# Patient Record
Sex: Female | Born: 1975 | Race: White | Hispanic: No | Marital: Married | Smoking: Never smoker
Health system: Southern US, Community
[De-identification: ages and names within clinical notes are randomized; demographics above are authoritative.]

## PROBLEM LIST (undated history)

## (undated) DIAGNOSIS — Z8041 Family history of malignant neoplasm of ovary: Secondary | ICD-10-CM

## (undated) DIAGNOSIS — Z800 Family history of malignant neoplasm of digestive organs: Secondary | ICD-10-CM

## (undated) DIAGNOSIS — Z803 Family history of malignant neoplasm of breast: Secondary | ICD-10-CM

## (undated) HISTORY — DX: Family history of malignant neoplasm of digestive organs: Z80.0

## (undated) HISTORY — DX: Family history of malignant neoplasm of breast: Z80.3

## (undated) HISTORY — DX: Family history of malignant neoplasm of ovary: Z80.41

---

## 2002-03-20 ENCOUNTER — Encounter: Admission: RE | Admit: 2002-03-20 | Discharge: 2002-03-20 | Payer: Self-pay | Admitting: Obstetrics and Gynecology

## 2002-03-20 ENCOUNTER — Other Ambulatory Visit: Admission: RE | Admit: 2002-03-20 | Discharge: 2002-03-20 | Payer: Self-pay | Admitting: Obstetrics and Gynecology

## 2011-10-12 ENCOUNTER — Other Ambulatory Visit: Payer: Self-pay | Admitting: Family Medicine

## 2011-10-12 DIAGNOSIS — Z1231 Encounter for screening mammogram for malignant neoplasm of breast: Secondary | ICD-10-CM

## 2011-11-16 ENCOUNTER — Other Ambulatory Visit: Payer: Self-pay | Admitting: Obstetrics and Gynecology

## 2011-11-16 ENCOUNTER — Other Ambulatory Visit (HOSPITAL_COMMUNITY)
Admission: RE | Admit: 2011-11-16 | Discharge: 2011-11-16 | Disposition: A | Discharge status: Home / Self Care | Payer: Commercial Indemnity | Source: Ambulatory Visit | Attending: Obstetrics and Gynecology | Admitting: Obstetrics and Gynecology

## 2011-11-16 DIAGNOSIS — Z113 Encounter for screening for infections with a predominantly sexual mode of transmission: Secondary | ICD-10-CM | POA: Insufficient documentation

## 2011-11-16 DIAGNOSIS — Z01419 Encounter for gynecological examination (general) (routine) without abnormal findings: Secondary | ICD-10-CM | POA: Insufficient documentation

## 2011-11-16 DIAGNOSIS — N760 Acute vaginitis: Secondary | ICD-10-CM | POA: Insufficient documentation

## 2011-12-15 ENCOUNTER — Ambulatory Visit
Admission: RE | Admit: 2011-12-15 | Discharge: 2011-12-15 | Disposition: A | Discharge status: Home / Self Care | Payer: Commercial Indemnity | Source: Ambulatory Visit | Attending: Family Medicine | Admitting: Family Medicine

## 2011-12-15 DIAGNOSIS — Z1231 Encounter for screening mammogram for malignant neoplasm of breast: Secondary | ICD-10-CM

## 2011-12-20 ENCOUNTER — Other Ambulatory Visit: Payer: Self-pay | Admitting: Family Medicine

## 2011-12-20 DIAGNOSIS — R928 Other abnormal and inconclusive findings on diagnostic imaging of breast: Secondary | ICD-10-CM

## 2011-12-27 ENCOUNTER — Ambulatory Visit
Admission: RE | Admit: 2011-12-27 | Discharge: 2011-12-27 | Disposition: A | Discharge status: Home / Self Care | Payer: Commercial Indemnity | Source: Ambulatory Visit | Attending: Family Medicine | Admitting: Family Medicine

## 2011-12-27 ENCOUNTER — Other Ambulatory Visit: Payer: Self-pay | Admitting: Family Medicine

## 2011-12-27 DIAGNOSIS — R928 Other abnormal and inconclusive findings on diagnostic imaging of breast: Secondary | ICD-10-CM

## 2012-01-08 ENCOUNTER — Ambulatory Visit
Admission: RE | Admit: 2012-01-08 | Discharge: 2012-01-08 | Disposition: A | Discharge status: Home / Self Care | Payer: Commercial Indemnity | Source: Ambulatory Visit | Attending: Family Medicine | Admitting: Family Medicine

## 2012-01-08 ENCOUNTER — Other Ambulatory Visit: Payer: Self-pay | Admitting: Family Medicine

## 2012-01-08 DIAGNOSIS — R928 Other abnormal and inconclusive findings on diagnostic imaging of breast: Secondary | ICD-10-CM

## 2013-04-21 ENCOUNTER — Other Ambulatory Visit: Payer: Self-pay | Admitting: Family Medicine

## 2013-04-21 DIAGNOSIS — N6310 Unspecified lump in the right breast, unspecified quadrant: Secondary | ICD-10-CM

## 2013-07-08 ENCOUNTER — Ambulatory Visit
Admission: RE | Admit: 2013-07-08 | Discharge: 2013-07-08 | Disposition: A | Discharge status: Home / Self Care | Payer: Managed Care, Other (non HMO) | Source: Ambulatory Visit | Attending: Family Medicine | Admitting: Family Medicine

## 2013-07-08 ENCOUNTER — Other Ambulatory Visit: Payer: Self-pay | Admitting: Family Medicine

## 2013-07-08 ENCOUNTER — Ambulatory Visit
Admission: RE | Admit: 2013-07-08 | Discharge: 2013-07-08 | Disposition: A | Discharge status: Home / Self Care | Payer: Managed Care, Other (non HMO) | Source: Ambulatory Visit

## 2013-07-08 DIAGNOSIS — N6310 Unspecified lump in the right breast, unspecified quadrant: Secondary | ICD-10-CM

## 2015-06-10 ENCOUNTER — Other Ambulatory Visit: Payer: Self-pay

## 2015-06-10 DIAGNOSIS — Z1231 Encounter for screening mammogram for malignant neoplasm of breast: Secondary | ICD-10-CM

## 2015-06-28 ENCOUNTER — Ambulatory Visit
Admission: RE | Admit: 2015-06-28 | Discharge: 2015-06-28 | Disposition: A | Discharge status: Home / Self Care | Payer: Managed Care, Other (non HMO) | Source: Ambulatory Visit

## 2015-06-28 DIAGNOSIS — Z1231 Encounter for screening mammogram for malignant neoplasm of breast: Secondary | ICD-10-CM

## 2018-01-17 ENCOUNTER — Other Ambulatory Visit: Payer: Self-pay | Admitting: Internal Medicine

## 2018-01-17 DIAGNOSIS — Z1231 Encounter for screening mammogram for malignant neoplasm of breast: Secondary | ICD-10-CM

## 2018-02-26 ENCOUNTER — Ambulatory Visit
Admission: RE | Admit: 2018-02-26 | Discharge: 2018-02-26 | Disposition: A | Discharge status: Home / Self Care | Payer: Managed Care, Other (non HMO) | Source: Ambulatory Visit | Attending: Internal Medicine | Admitting: Internal Medicine

## 2018-02-26 DIAGNOSIS — Z1231 Encounter for screening mammogram for malignant neoplasm of breast: Secondary | ICD-10-CM

## 2018-04-04 ENCOUNTER — Telehealth: Payer: Self-pay | Admitting: Genetic Counselor

## 2018-04-04 ENCOUNTER — Encounter: Payer: Self-pay | Admitting: Genetic Counselor

## 2018-04-04 NOTE — Telephone Encounter (Signed)
A genetic counseling appt has been scheduled for the pt to see Maylon Cos on 3/9 at 2pm. Letter mailed.

## 2018-04-22 ENCOUNTER — Encounter: Payer: Self-pay | Admitting: Genetic Counselor

## 2018-04-22 ENCOUNTER — Inpatient Hospital Stay: Payer: Managed Care, Other (non HMO) | Attending: Genetic Counselor | Admitting: Genetic Counselor

## 2018-04-22 ENCOUNTER — Inpatient Hospital Stay: Payer: Managed Care, Other (non HMO)

## 2018-04-22 DIAGNOSIS — Z800 Family history of malignant neoplasm of digestive organs: Secondary | ICD-10-CM | POA: Diagnosis not present

## 2018-04-22 DIAGNOSIS — Z8041 Family history of malignant neoplasm of ovary: Secondary | ICD-10-CM | POA: Insufficient documentation

## 2018-04-22 DIAGNOSIS — Z803 Family history of malignant neoplasm of breast: Secondary | ICD-10-CM | POA: Diagnosis not present

## 2018-04-22 NOTE — Progress Notes (Signed)
REFERRING PROVIDER: Garwin Brothers, MD [address], [place] [address]  PRIMARY PROVIDER:  Garwin Brothers, MD  PRIMARY REASON FOR VISIT:  1. Family history of breast cancer   2. Family history of ovarian cancer   3. Family history of pancreatic cancer      HISTORY OF PRESENT ILLNESS:   [name], a 43 y.o. female, was seen for a [facility] cancer genetics consultation at the request of Dr. Reesa Chew due to a family history of breast, ovarian and pancreatic cancer.  [name] presents to clinic today to discuss the possibility of a hereditary predisposition to cancer, genetic testing, and to further clarify her future cancer risks, as well as potential cancer risks for family members.   [name] is a 43 y.o. female with no personal history of cancer.  She has several family members who have had early onset breast or ovarian cancer and had reportedly negative testing. [name] is unsure if the testing was more extensive than just BRCA1/BRCA2.  CANCER HISTORY:   No history exists.     RISK FACTORS:  Menarche was at age 59.  First live birth at age 45.  OCP use for approximately 0 years.  Ovaries intact: yes.  Hysterectomy: no.  Menopausal status: premenopausal.  HRT use: 0 years. Colonoscopy: no; not examined. Mammogram within the last year: yes. Number of breast biopsies: 1. Up to date with pelvic exams: yes. Any excessive radiation exposure in the past: no  Past Medical History:  Diagnosis Date  . Family history of breast cancer   . Family history of ovarian cancer   . Family history of pancreatic cancer     Social History   Socioeconomic History  . Marital status: Single    Spouse name: Not on file  . Number of children: Not on file  . Years of education: Not on file  . Highest education level: Not on file  Occupational History  . Not on file  Social Needs  . Financial resource strain: Not on file  . Food insecurity:    Worry: Not on file    Inability: Not on file   . Transportation needs:    Medical: Not on file    Non-medical: Not on file  Tobacco Use  . Smoking status: Not on file  Substance and Sexual Activity  . Alcohol use: Not on file  . Drug use: Not on file  . Sexual activity: Not on file  Lifestyle  . Physical activity:    Days per week: Not on file    Minutes per session: Not on file  . Stress: Not on file  Relationships  . Social connections:    Talks on phone: Not on file    Gets together: Not on file    Attends religious service: Not on file    Active member of club or organization: Not on file    Attends meetings of clubs or organizations: Not on file    Relationship status: Not on file  Other Topics Concern  . Not on file  Social History Narrative  . Not on file     FAMILY HISTORY:  We obtained a detailed, 4-generation family history.  Significant diagnoses are listed below: Family History  Problem Relation Age of Onset  . Breast cancer Mother 13  . Ovarian cancer Mother 78  . Melanoma Father   . Breast cancer Sister 42       d. 80  . Lymphoma Maternal  Aunt   . Lung cancer Maternal Uncle   . Pancreatic cancer Maternal Grandfather   . Stroke Paternal Grandmother   . Stroke Paternal Grandfather   . Brain cancer Other        MGMs sister  . Ovarian cancer Sister 50    The patient has two sons and a daughter who are cancer free.  She has a brother and two sisters.  One sister was diagnosed with breast cancer at 31 and died at 57. She reportedly had negative genetic testing. The other sister went through a hysterectomy for bleeding issues and was diagnosed with ovarian cancer.  The patient's parents are living.  The patient's mother had breast cancer at 45 and ovarian cancer at 89.  She reportedly has had negative genetic testing in the past.  She had a full sister and brother and a paternal half sister and brother.  Her full sister had lymphoma a and her full brother had lung cancer.  Her half sister had a son with a  brain tumor.  The maternal grandparents are deceased. The grandfather had pancreatic cancer and his sister had lymphoma.  The patient's father had melanoma.  He had two brothers and a sister who are cancer free.  His parents both died of a stroke.  Sarah Dunn is aware of previous family history of genetic testing for hereditary cancer risks. Patient's maternal ancestors are of Caucasian descent, and paternal ancestors are of [place] descent. There is no reported Ashkenazi Jewish ancestry. There is no known consanguinity.  GENETIC COUNSELING ASSESSMENT: [name] is a 43 y.o. female with a family history of breast, ovarian and pancreatic cancer which is somewhat suggestive of a hereditary cancer syndrome and predisposition to cancer. We, therefore, discussed and recommended the following at today's visit.   DISCUSSION: We discussed that 5 - 10% of breast cancer is hereditary, with most cases associated with BRCA mutations.  There are other genes that can be associated with hereditary breast cancer syndromes.  These include ATM, CHEK2 and PALB2.  Ovarian cancer can also be hereditary in about 15-20% of cases, most commonly due to BRCA mutations.  Although, just like breast cancer, there are other genes that can increase the risk for ovarian cancer outside of BRCA mutations.  The patient's sister and mother reportedly had negative genetic testing.  We discussed that if their testing was negative, then hers most likely would be as well..    We reviewed the characteristics, features and inheritance patterns of hereditary cancer syndromes. We also discussed genetic testing, including the appropriate family members to test, the process of testing, insurance coverage and turn-around-time for results. We discussed the implications of a negative, positive and/or variant of uncertain significant result. We recommended [name] pursue genetic testing for the CustomNext-Cancer+RNAinsight gene panel. The CustomNext-Expanded  gene panel offered by [facility] and includes sequencing and rearrangement analysis for the following 81 genes: AIP, ALK, APC*, ATM*, AXIN2, BAP1, BARD1, BLM, BMPR1A, BRCA1*, BRCA2*, BRIP1*, CDC73, CDH1*, CDK4, CDKN1B, CDKN2A, CHEK2*, CTNNA1, DICER1, FANCC, FH, FLCN, GALNT12, HOXB13, KIT, MAX, MEN1, MET, MLH1*, MRE11A, MSH2*, MSH6*, MUTYH*, NBN, NF1*, NF2, NTHL1, PALB2*, PDGFRA, PHOX2B, PMS2*, POLD1, POLE, POT1, PRKAR1A, PTCH1, PTEN*, RAD50, RAD51C*, RAD51D*, RB1, RET, SDHA, SDHAF2, SDHB, SDHC, SDHD, SMAD4, SMARCA4, SMARCB1, SMARCE1, STK11, SUFU, TMEM127, TP53*, TSC1, TSC2, VHL and XRCC2 (sequencing and deletion/duplication); CASR, CFTR, CPA1, CTRC, EGFR, MITF, PRSS1 and SPINK1 (sequencing only); EPCAM and GREM1 (deletion/duplication only). DNA and RNA analyses performed for * genes.  Based on Ms. Schoff's family history of cancer, she meets medical criteria for genetic testing. Despite that she meets criteria, she may still have an out of pocket cost. We discussed that if her out of pocket cost for testing is over $100, the laboratory will call and confirm whether she wants to proceed with testing.  If the out of pocket cost of testing is less than $100 she will be billed by the genetic testing laboratory.   PLAN: After considering the risks, benefits, and limitations, [name] provided informed consent to pursue genetic testing and the blood sample was sent to [facility] for analysis of the CustomNext-Cancer+RNAinsight. Results should be available within approximately 2-3 weeks' time, at which point they will be disclosed by telephone to [name], as will any additional recommendations warranted by these results. [name] will receive a summary of her genetic counseling visit and a copy of her results once available. This information will also be available in Epic.   Lastly, we encouraged [name] to remain in contact with cancer genetics annually so that we can continuously update the  family history and inform her of any changes in cancer genetics and testing that may be of benefit for this family.   [name]'s questions were answered to her satisfaction today. Our contact information was provided should additional questions or concerns arise. Thank you for the referral and allowing [place] to share in the care of your patient.    P. Florene Glen, [place], [facility] Certified Genetic Counselor [email or web address] phone: [phone]  The patient was seen for a total of 45 minutes in face-to-face genetic counseling.  This patient was discussed with Drs. Magrinat, Lindi Adie and/or Burr Medico who agrees with the above.    _______________________________________________________________________ For Office Staff:  Number of people involved in session: 1 Was an Intern/ student involved with case: no

## 2018-05-09 ENCOUNTER — Encounter: Payer: Self-pay | Admitting: Genetic Counselor

## 2018-05-09 ENCOUNTER — Telehealth: Payer: Self-pay | Admitting: Genetic Counselor

## 2018-05-09 ENCOUNTER — Ambulatory Visit: Payer: Self-pay | Admitting: Genetic Counselor

## 2018-05-09 DIAGNOSIS — Z1379 Encounter for other screening for genetic and chromosomal anomalies: Secondary | ICD-10-CM

## 2018-05-09 DIAGNOSIS — Z0000 Encounter for general adult medical examination without abnormal findings: Secondary | ICD-10-CM | POA: Insufficient documentation

## 2018-05-09 NOTE — Telephone Encounter (Signed)
LM on VM that results are back and to please CB. 

## 2018-05-09 NOTE — Telephone Encounter (Signed)
Revealed negative genetic testing for hereditary cancer syndromes.  Discussed that we do not know why there is cancer in the family. It could be due to a different gene that we are not testing, or maybe our current technology may not be able to pick something up.  It will be important for her to keep in contact with genetics to keep up with whether additional testing may be needed.  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  Her children would be eligible for screening to see if they too are carriers as well as her siblings.

## 2018-05-09 NOTE — Progress Notes (Signed)
HPI:  Sarah Dunn was previously seen in the [place] clinic due to a family history of cancer and concerns regarding a hereditary predisposition to cancer. Please refer to our prior cancer genetics clinic note for more information regarding our discussion, assessment and recommendations, at the time. Sarah Dunn's recent genetic test results were disclosed to her, as were recommendations warranted by these results. These results and recommendations are discussed in more detail below.  CANCER HISTORY:   No history exists.    FAMILY HISTORY:  We obtained a detailed, 4-generation family history.  Significant diagnoses are listed below: Family History  Problem Relation Age of Onset   Breast cancer Mother 67   Ovarian cancer Mother 61   Melanoma Father    Breast cancer Sister 31       d. 41   Lymphoma Maternal Aunt    Lung cancer Maternal Uncle    Pancreatic cancer Maternal Grandfather    Stroke Paternal Grandmother    Stroke Paternal Grandfather    Brain cancer Other        MGMs sister   Ovarian cancer Sister 12    The patient has two sons and a daughter who are cancer free.  She has a brother and two sisters.  One sister was diagnosed with breast cancer at 8 and died at 99. She reportedly had negative genetic testing. The other sister went through a hysterectomy for bleeding issues and was diagnosed with ovarian cancer.  The patient's parents are living.  The patient's mother had breast cancer at 21 and ovarian cancer at 76.  She reportedly has had negative genetic testing in the past.  She had a full sister and brother and a paternal half sister and brother.  Her full sister had lymphoma a and her full brother had lung cancer.  Her half sister had a son with a brain tumor.  The maternal grandparents are deceased. The grandfather had pancreatic cancer and his sister had lymphoma.  The patient's father had melanoma.  He had two brothers and a sister who are cancer  free.  His parents both died of a stroke.  Sarah Dunn is aware of previous family history of genetic testing for hereditary cancer risks. Patient's maternal ancestors are of Caucasian descent, and paternal ancestors are of [place] descent. There is no reported Ashkenazi Jewish ancestry. There is no known consanguinity.   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  Since [name] has only one pathogenic mutation in CFTR, she is NOT affected with Cystic Fibrosis, but instead is a carrier. The CustomNext-Expanded gene panel offered by [facility] and includes sequencing and rearrangement analysis for the following 81 genes: AIP, ALK, APC*, ATM*, AXIN2, BAP1, BARD1, BLM, BMPR1A, BRCA1*, BRCA2*, BRIP1*, CDC73, CDH1*, CDK4, CDKN1B, CDKN2A, CHEK2*, CTNNA1, DICER1, FANCC, FH, FLCN, GALNT12, HOXB13, KIT, MAX, MEN1, MET, MLH1*, MRE11A, MSH2*, MSH6*, MUTYH*, NBN, NF1*, NF2, NTHL1, PALB2*, PDGFRA, PHOX2B, PMS2*, POLD1, POLE, POT1, PRKAR1A, PTCH1, PTEN*, RAD50, RAD51C*, RAD51D*, RB1, RET, SDHA, SDHAF2, SDHB, SDHC, SDHD, SMAD4, SMARCA4, SMARCB1, SMARCE1, STK11, SUFU, TMEM127, TP53*, TSC1, TSC2, VHL and XRCC2 (sequencing and deletion/duplication); CASR, CFTR, CPA1, CTRC, EGFR, MITF, PRSS1 and SPINK1 (sequencing only); EPCAM and GREM1 (deletion/duplication only). DNA and RNA analyses performed for * genes. The test report has been scanned into EPIC and is located under the Molecular Pathology section of the Results Review tab.  A portion of the result report is included below for reference.    CLINICAL CONDITION: The CFTR gene is associated with autosomal recessive cystic fibrosis (MedGen UID: 33354). Other CFTR-related disorders include congenital bilateral absence of the vas deferens (CBAVD; MedGen UID: [id number]) and an increased risk for pancreatitis (PMID:  38937342, 87681157).  Hereditary pancreatitis is characterized by recurring episodes of acute inflammation of the pancreas beginning in childhood or adolescence and leading to chronic pancreatitis. The clinical presentation is highly variable and may include chronic abdominal pain, impairment of endocrine and exocrine pancreatic function, nausea and vomiting, maldigestion, and diabetes (WIOM:[id number]). Pathogenic variants in CFTR may confer an approximately four- to ten-fold increased risk for chronic pancreatitis in heterozygous carriers. Other genetic and environmental risk factors are also known to modify this risk (PMID: 38453646, 8032122, 48250037, 04888916, 94503888, 28003491, 79150569). Chronic pancreatitis is a risk factor for pancreatic cancer (PMID: 79480165).  CANCER SCREENING RECOMMENDATIONS: The single pathogenic mutation in CFTR does not explain the cancer in her family, and therefore we have not identified a hereditary cause for her family history of cancer at this time. Most cancers happen by chance and this negative test suggests that her cancer may fall into this category.    While reassuring, this does not definitively rule out a hereditary predisposition to cancer. It is still possible that there could be genetic mutations that are undetectable by current technology. There could be genetic mutations in genes that have not been tested or identified to increase cancer risk.  Therefore, it is recommended she continue to follow the cancer management and screening guidelines provided by her primary healthcare provider.   An individual's cancer risk and medical management are not determined by genetic test results alone. Overall cancer risk assessment incorporates additional factors, including personal medical history, family history, and any available genetic information that may result in a personalized plan for cancer prevention and surveillance  INHERITANCE:  Cystic fibrosis is inherited  in an autosomal recessive inheritance, with affected individuals having two pathogenic variants-one in each copy of their CFTR genes. Affected individuals will pass one pathogenic CFTR variant to all of their children. For partners who each carry a pathogenic variant in CFTR, the risk of having have an affected child is 25% (per pregnancy).  The increased risk for hereditary pancreatitis follows an autosomal dominant pattern of inheritance. This means that an individual with a single pathogenic variant has a 50% chance of passing along that variant and the increased disease risk to their offspring.  Family members of individuals with pathogenic CFTR variants may consider genetic testing, as they may be carriers as well.  RECOMMENDATIONS FOR FAMILY MEMBERS:  Carriers of a single CFTR pathogenic mutation are also at an increased risk of having a child affected with CF. For those of reproductive age, partners of known carriers should consider genetic testing to determine their reproductive risk. Around 1 in 5 people in the [place] are carriers of CF ([place]. http://www.anderson-foster.com/.); however, this risk varies slightly based on ethnicity. Reproductive options are available for at-risk carrier couples, including prenatal diagnosis, IVF with preimplantation genetic diagnosis (PGD), gamete donation, and adoption. Additionally, newborn screening for CF is available in every state.  It is important that all of Ms. Hove's relatives (both men and women) know of the presence of this gene mutation. Site-specific genetic testing can sort out who in the family is at risk and who is not.   Ms. Whittinghill's siblings and children have a 50% chance to  have inherited this mutation. We recommend they have genetic testing for this same mutation, as identifying the presence of this mutation would allow them to also take advantage of risk-reducing measures. However, one of her children is relatively young (e.g. under 18) and this  will not be of any consequence to them for several years. We do not test children because there is no risk to them until they are adults. We recommend they have genetic counseling and testing by the time they are in their early 20's.    Individuals in this family might be at some increased risk of developing cancer, over the general population risk, simply due to the family history of cancer.  We recommended women in this family have a yearly mammogram beginning at age 25, or 9 years younger than the earliest onset of cancer, an annual clinical breast exam, and perform monthly breast self-exams. Women in this family should also have a gynecological exam as recommended by their primary provider. All family members should have a colonoscopy by age 27.  It is also possible there is a hereditary cause for the cancer in Ms. Handyside's family that she did not inherit and therefore was not identified in her.  Based on Ms. Furuya's family history, we recommended her mother and sister, who were diagnosed with breast and ovarian cancer, respectively, have genetic counseling and testing. Ms. Stotler will let us know if we can be of any assistance in coordinating genetic counseling and/or testing for this family member.   FOLLOW-UP: Lastly, we discussed with Ms. Counterman that cancer genetics is a rapidly advancing field and it is possible that new genetic tests will be appropriate for her and/or her family members in the future. We encouraged her to remain in contact with cancer genetics on an annual basis so we can update her personal and family histories and let her know of advances in cancer genetics that may benefit this family.   Our contact number was provided. Ms. Warga's questions were answered to her satisfaction, and she knows she is welcome to call us at anytime with additional questions or concerns.   Roma Kayser, MS, [facility] Certified Genetic Counselor [email or web address]

## 2018-05-10 ENCOUNTER — Encounter: Payer: Self-pay | Admitting: Genetic Counselor

## 2020-02-04 IMAGING — MG DIGITAL SCREENING BILATERAL MAMMOGRAM WITH TOMO AND CAD
8 series · 8 of 24 positions shown · non-contrast
Comparison: Previous exam(s).

CLINICAL DATA: Screening.

EXAM:
DIGITAL SCREENING BILATERAL MAMMOGRAM WITH TOMO AND CAD

[L MLO synth-2D]
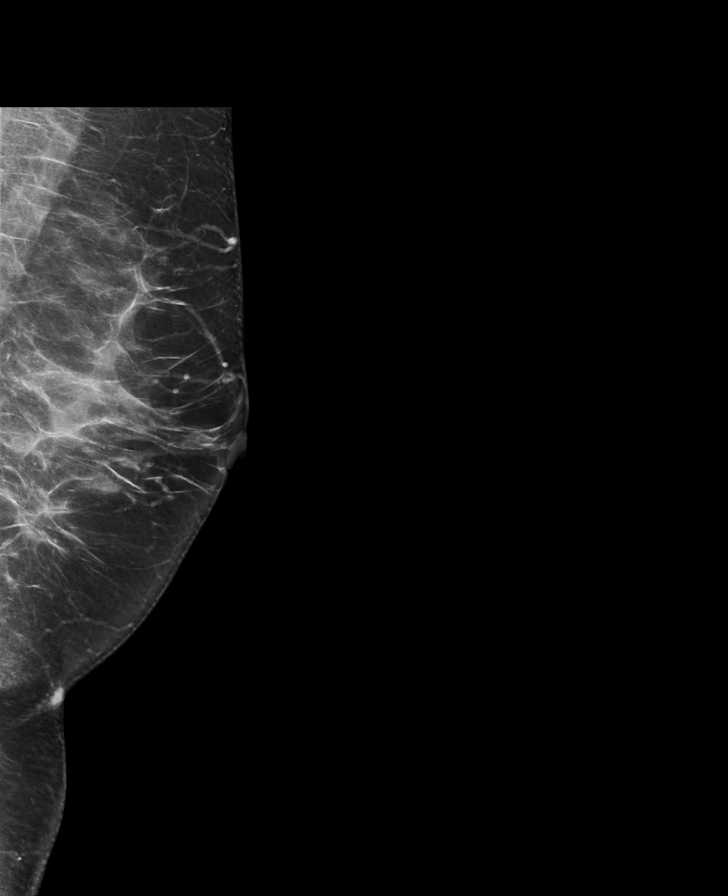

[R CC synth-2D]
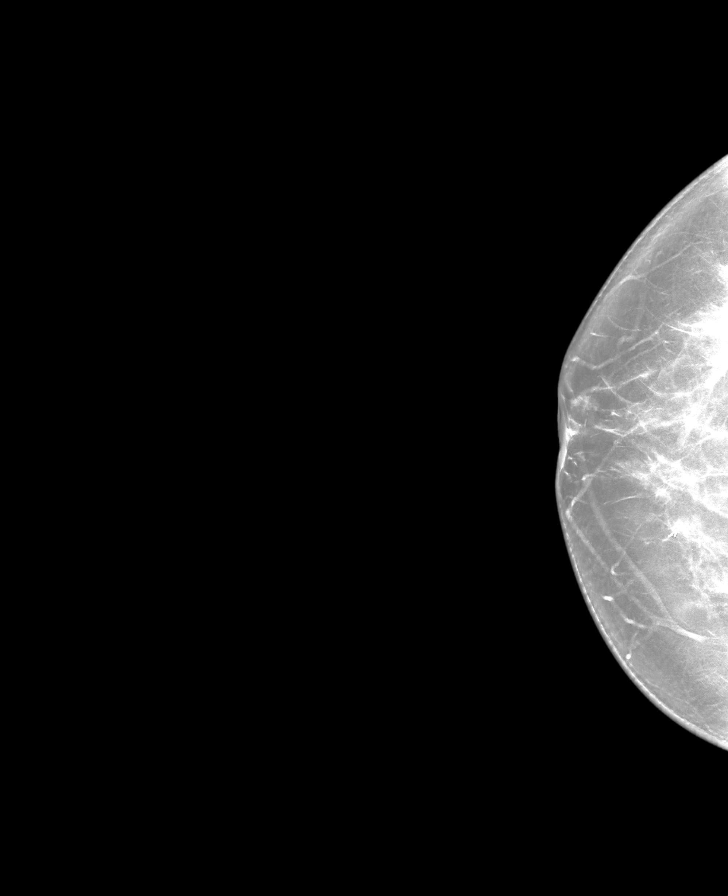

[R MLO synth-2D]
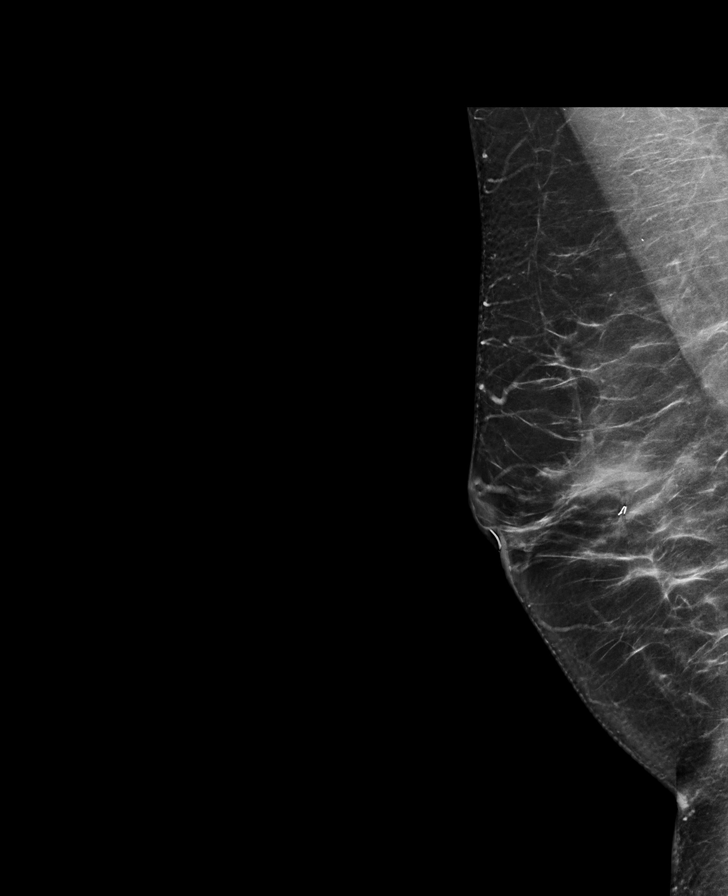

[L CC synth-2D]
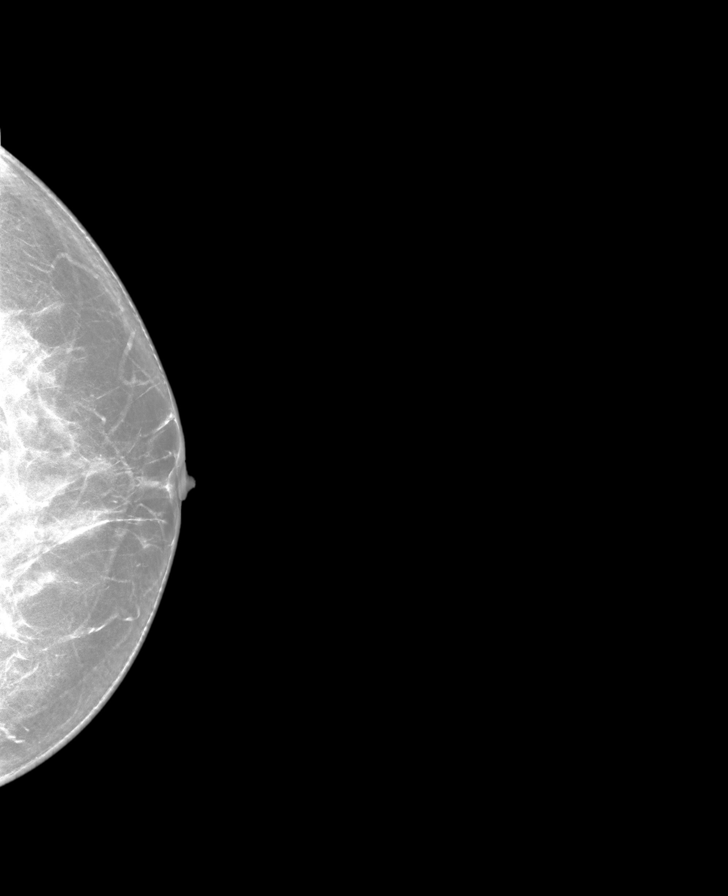

[R MLO tomo · tomo slice 43/86.0]
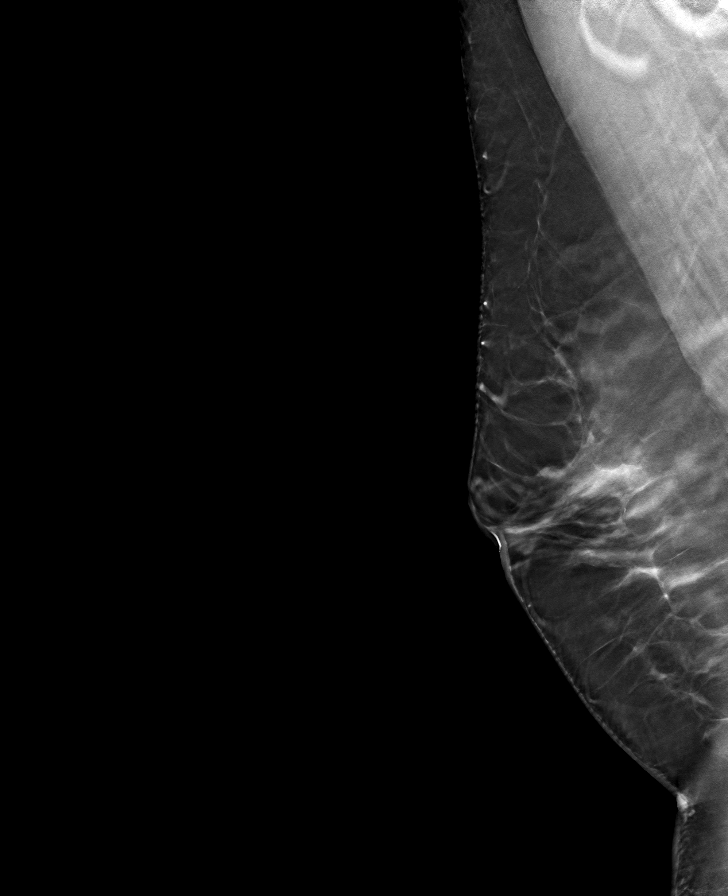

[R CC tomo · tomo slice 39/78.0]
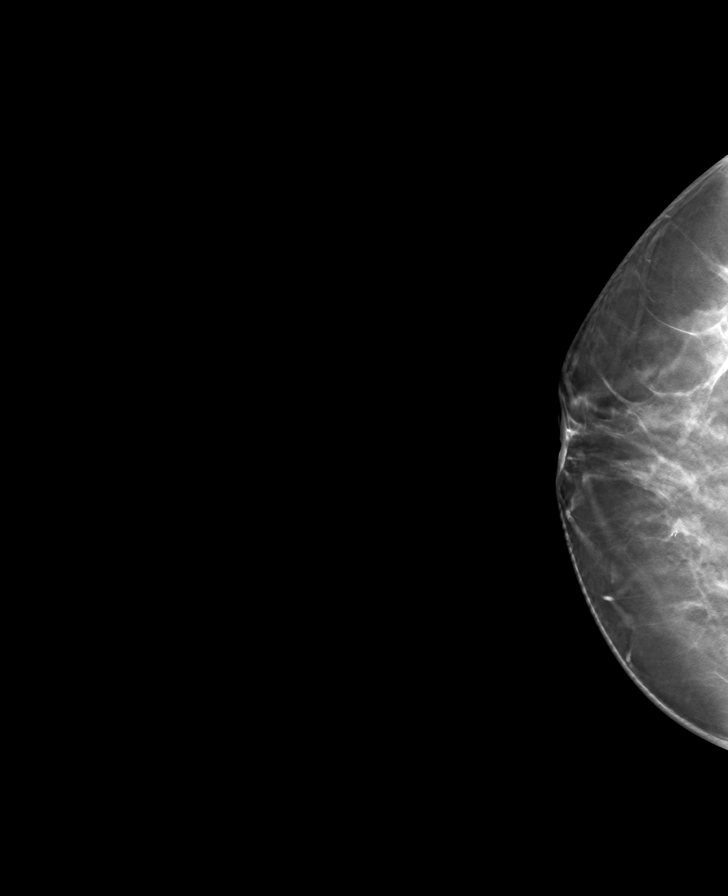

[L CC tomo · tomo slice 37/74.0]
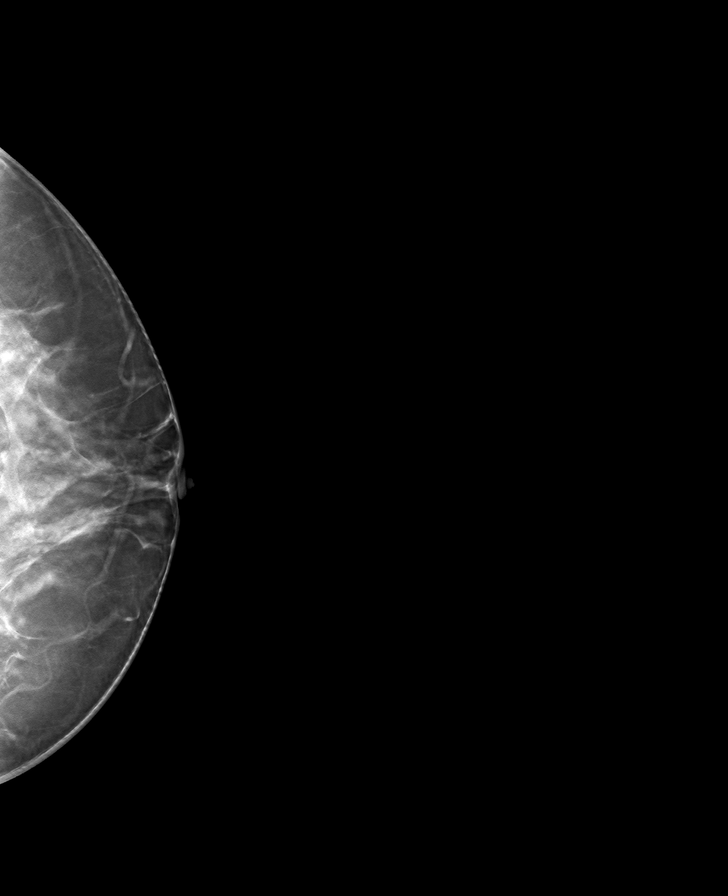

[L MLO tomo · tomo slice 43/84.0]
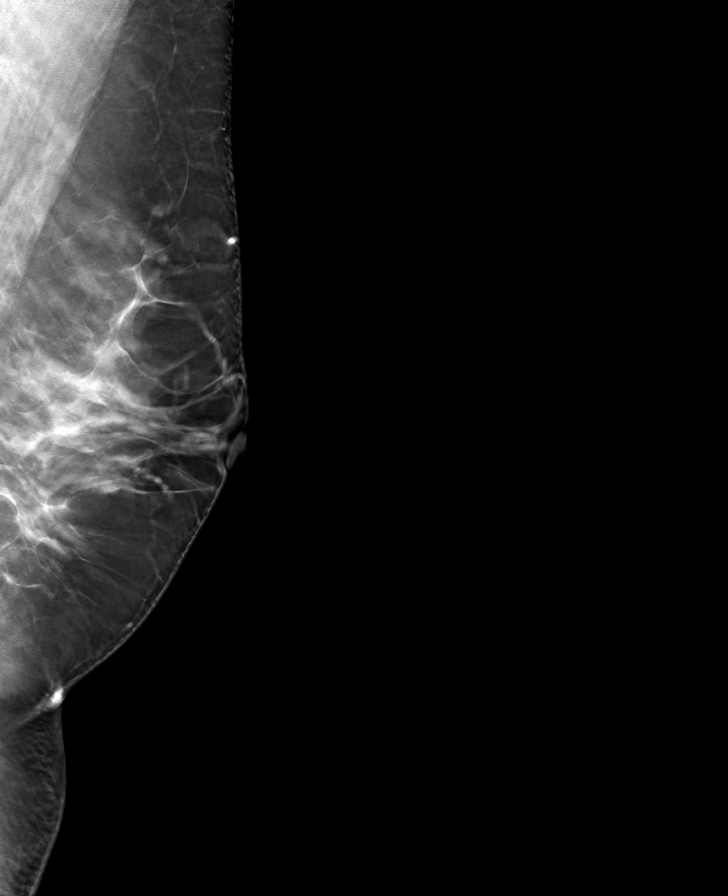

[8 of 24 positions shown; findings below may reference images not displayed]

ACR Breast Density Category c: The breast tissue is heterogeneously
dense, which may obscure small masses.
FINDINGS: There are no findings suspicious for malignancy. Images were
processed with CAD.
IMPRESSION: No mammographic evidence of malignancy. A result letter of this
screening mammogram will be mailed directly to the patient.

RECOMMENDATION:
Screening mammogram in one year. (Code:[id number]-LHB)

BI-RADS CATEGORY  1: Negative.

## 2022-03-13 ENCOUNTER — Ambulatory Visit: Payer: Managed Care, Other (non HMO) | Admitting: Internal Medicine

## 2022-03-13 ENCOUNTER — Encounter: Payer: Self-pay | Admitting: Internal Medicine

## 2022-03-13 VITALS — BP 130/100 | HR 93 | Temp 98.3°F | Resp 18 | Ht 69.0 in | Wt 260.0 lb

## 2022-03-13 DIAGNOSIS — F419 Anxiety disorder, unspecified: Secondary | ICD-10-CM

## 2022-03-13 DIAGNOSIS — F3342 Major depressive disorder, recurrent, in full remission: Secondary | ICD-10-CM | POA: Diagnosis not present

## 2022-03-13 DIAGNOSIS — R002 Palpitations: Secondary | ICD-10-CM | POA: Diagnosis not present

## 2022-03-13 DIAGNOSIS — F32A Depression, unspecified: Secondary | ICD-10-CM | POA: Insufficient documentation

## 2022-03-13 MED ORDER — PROPRANOLOL HCL 20 MG PO TABS: 20.0000 mg | ORAL_TABLET | Freq: Two times a day (BID) | ORAL | 3 refills | Status: DC

## 2022-03-13 MED ORDER — BUSPIRONE HCL 7.5 MG PO TABS: 7.5000 mg | ORAL_TABLET | Freq: Two times a day (BID) | ORAL | 3 refills | Status: AC

## 2022-03-13 MED ORDER — BUPROPION HCL ER (XL) 300 MG PO TB24: 300.0000 mg | ORAL_TABLET | Freq: Every day | ORAL | 3 refills | Status: DC

## 2022-03-13 MED ORDER — BUPROPION HCL ER (SR) 150 MG PO TB12: 150.0000 mg | ORAL_TABLET | Freq: Every day | ORAL | 3 refills | Status: DC

## 2022-03-13 NOTE — Progress Notes (Addendum)
   Office Visit  Subjective   Patient ID: Sarah Dunn   DOB: 11-20-1975   Age: 47 y.o.   MRN: [id number]   Chief Complaint Chief Complaint  Patient presents with   Follow-up    Medication refill   Medication Refill     History of Present Illness 47 years old female with history of anxiety, depression, obesity and palpitation is here for follow-up.  She says that she is still depressed even though she take bupropion 450 mg daily.  She does not have any suicidal ideation.  She also has anxiety and sometimes take buspirone 7.5 mg daily. She also has palpitation and her symptoms are controlled with propranolol.  He was supposed to take 20 mg twice a day but take mostly once a day. She is also obese lady with BMI of 38 and she says that she is struggled to lose weight.  She has actually gained 5 more pounds since last visit.  Medication Refill     Past Medical History Past Medical History:  Diagnosis Date   Family history of breast cancer    Family history of ovarian cancer    Family history of pancreatic cancer      Allergies No Known Allergies   Review of Systems Review of Systems  Constitutional: Negative.   HENT: Negative.    Respiratory: Negative.    Cardiovascular: Negative.   Gastrointestinal: Negative.   Neurological: Negative.        Objective:    Vitals BP (!) 130/100 (BP Location: Left Arm, Patient Position: Sitting, Cuff Size: Normal)   Pulse 93   Temp 98.3 F (36.8 C)   Resp 18   Ht 5\' 9"  (1.753 m)   Wt 260 lb (117.9 kg)   SpO2 98%   BMI 38.40 kg/m    Physical Examination Physical Exam Constitutional:      Appearance: Normal appearance. She is obese.  Cardiovascular:     Rate and Rhythm: Normal rate and regular rhythm.     Heart sounds: Normal heart sounds.  Pulmonary:     Effort: Pulmonary effort is normal.     Breath sounds: Normal breath sounds.  Neurological:     General: No focal deficit present.     Mental Status: She is  alert and oriented to person, place, and time.        Assessment & Plan:   Depression stable  Palpitations controlled   Return in about 3 months (around 06/12/2022).  She will come fasting for blood draw next visit.  Garwin Brothers, MD

## 2022-03-13 NOTE — Assessment & Plan Note (Signed)
stable °

## 2022-03-13 NOTE — Assessment & Plan Note (Signed)
controlled 

## 2022-06-14 ENCOUNTER — Encounter: Payer: Self-pay | Admitting: Internal Medicine

## 2022-06-14 ENCOUNTER — Ambulatory Visit: Payer: Managed Care, Other (non HMO) | Admitting: Internal Medicine

## 2022-06-14 VITALS — BP 130/90 | HR 64 | Temp 97.8°F | Resp 18 | Ht 69.0 in | Wt 263.5 lb

## 2022-06-14 DIAGNOSIS — Z131 Encounter for screening for diabetes mellitus: Secondary | ICD-10-CM

## 2022-06-14 DIAGNOSIS — R002 Palpitations: Secondary | ICD-10-CM

## 2022-06-14 DIAGNOSIS — F3342 Major depressive disorder, recurrent, in full remission: Secondary | ICD-10-CM

## 2022-06-14 DIAGNOSIS — Z6838 Body mass index (BMI) 38.0-38.9, adult: Secondary | ICD-10-CM

## 2022-06-14 DIAGNOSIS — R232 Flushing: Secondary | ICD-10-CM | POA: Diagnosis not present

## 2022-06-14 DIAGNOSIS — Z0000 Encounter for general adult medical examination without abnormal findings: Secondary | ICD-10-CM | POA: Diagnosis not present

## 2022-06-14 DIAGNOSIS — F419 Anxiety disorder, unspecified: Secondary | ICD-10-CM | POA: Diagnosis not present

## 2022-06-14 MED ORDER — GABAPENTIN 300 MG PO CAPS: 300.0000 mg | ORAL_CAPSULE | Freq: Every day | ORAL | 2 refills | Status: AC

## 2022-06-14 MED ORDER — WEGOVY 0.25 MG/0.5ML ~~LOC~~ SOAJ: 0.2500 mg | SUBCUTANEOUS | 1 refills | Status: AC

## 2022-06-14 NOTE — Assessment & Plan Note (Signed)
I will do FSH level and start her on gabapentin.

## 2022-06-14 NOTE — Assessment & Plan Note (Signed)
It is in remission

## 2022-06-14 NOTE — Assessment & Plan Note (Signed)
Controlled with propranolol 

## 2022-06-14 NOTE — Addendum Note (Signed)
Addended byEloisa Northern on: 06/14/2022 10:21 AM   Modules accepted: Orders

## 2022-06-14 NOTE — Assessment & Plan Note (Signed)
controlled 

## 2022-06-14 NOTE — Progress Notes (Signed)
   Office Visit  Subjective   Patient ID: Sarah Dunn   DOB: 11-06-1975   Age: 47 y.o.   MRN: [id number]   Chief Complaint Chief Complaint  Patient presents with   Annual Exam    Physical     History of Present Illness 47 years old female is here for annual physical examinations. She has anxiety, depression and palpitations and her symptoms are well controlled with medications.  No chest pain and no shortness of breast.  She work at [employer], she is married. She does not smoke and does not drink. She is still having periods. She still has her periods regular but started having hot flashes.   She says her mother has breast cancer, cervical cancer, she also has defibrillator. Her sister passed away with breast cancer.   She has mammogram over a year ago. She is due for mammogram. Her pap smear was 2019.   She has abnormal LFT where her hepatitis panel was negative. She was referred for liver ultrasound that was normal.   She get flu shot every year has done in October 23. She has one COVID vaccine.   Past Medical History Past Medical History:  Diagnosis Date   Family history of breast cancer    Family history of ovarian cancer    Family history of pancreatic cancer      Allergies No Known Allergies   Review of Systems Review of Systems  Constitutional: Negative.   HENT: Negative.    Respiratory: Negative.    Cardiovascular: Negative.   Gastrointestinal: Negative.   Genitourinary: Negative.   Musculoskeletal: Negative.   Neurological: Negative.        Objective:    Vitals BP (!) 130/90 (BP Location: Left Arm, Patient Position: Sitting, Cuff Size: Normal)   Pulse 64   Temp 97.8 F (36.6 C)   Resp 18   Ht 5\' 9"  (1.753 m)   Wt 263 lb 8 oz (119.5 kg)   LMP 05/23/2022 (Exact Date)   SpO2 97%   BMI 38.91 kg/m    Physical Examination Physical Exam Constitutional:      Appearance: Normal appearance. She is obese.  HENT:     Head: Normocephalic  and atraumatic.  Eyes:     Extraocular Movements: Extraocular movements intact.     Pupils: Pupils are equal, round, and reactive to light.  Cardiovascular:     Rate and Rhythm: Normal rate and regular rhythm.     Heart sounds: Normal heart sounds.  Pulmonary:     Effort: Pulmonary effort is normal.     Breath sounds: Normal breath sounds.  Abdominal:     General: Bowel sounds are normal.     Palpations: Abdomen is soft.  Neurological:     General: No focal deficit present.     Mental Status: She is alert and oriented to person, place, and time.        Assessment & Plan:   Palpitations Controlled with propranolol  Depression It is in remission  Anxiety controlled  BMI 38.0-38.9,adult Her BMI is 38. She eat more when she is under stress.  She will cut down portion of her meal.     Return in about 3 months (around 09/14/2022).   Eloisa Northern, MD

## 2022-06-14 NOTE — Assessment & Plan Note (Signed)
Her BMI is 38. She eat more when she is under stress.  She will cut down portion of her meal.

## 2022-06-15 LAB — CBC WITH DIFFERENTIAL/PLATELET
Basophils Absolute: 0.1 10*3/uL (ref 0.0–0.2)
Basos: 1 %
EOS (ABSOLUTE): 0.1 10*3/uL (ref 0.0–0.4)
Eos: 2 %
Hematocrit: 39.9 % (ref 34.0–46.6)
Hemoglobin: 13.4 g/dL (ref 11.1–15.9)
Immature Grans (Abs): 0 10*3/uL (ref 0.0–0.1)
Immature Granulocytes: 0 %
Lymphocytes Absolute: 2 10*3/uL (ref 0.7–3.1)
Lymphs: 33 %
MCH: 29.8 pg (ref 26.6–33.0)
MCHC: 33.6 g/dL (ref 31.5–35.7)
MCV: 89 fL (ref 79–97)
Monocytes Absolute: 0.4 10*3/uL (ref 0.1–0.9)
Monocytes: 7 %
Neutrophils Absolute: 3.5 10*3/uL (ref 1.4–7.0)
Neutrophils: 57 %
Platelets: 340 10*3/uL (ref 150–450)
RBC: 4.49 x10E6/uL (ref 3.77–5.28)
RDW: 12.8 % (ref 11.7–15.4)
WBC: 6.1 10*3/uL (ref 3.4–10.8)

## 2022-06-15 LAB — FOLLICLE STIMULATING HORMONE: FSH: 25.9 m[IU]/mL

## 2022-06-15 LAB — LIPID PANEL
Chol/HDL Ratio: 3.5 ratio (ref 0.0–4.4)
Cholesterol, Total: 167 mg/dL (ref 100–199)
HDL: 48 mg/dL (ref >39)
LDL Chol Calc (NIH): 107 mg/dL — ABNORMAL HIGH (ref 0–99)
Triglycerides: 62 mg/dL (ref 0–149)
VLDL Cholesterol Cal: 12 mg/dL (ref 5–40)

## 2022-06-15 LAB — CMP14 + ANION GAP
ALT: 46 IU/L — ABNORMAL HIGH (ref 0–32)
AST: 40 IU/L (ref 0–40)
Albumin/Globulin Ratio: 1.6 (ref 1.2–2.2)
Albumin: 4.4 g/dL (ref 3.9–4.9)
Alkaline Phosphatase: 148 IU/L — ABNORMAL HIGH (ref 44–121)
Anion Gap: 11 mmol/L (ref 10.0–18.0)
BUN/Creatinine Ratio: 11 (ref 9–23)
BUN: 10 mg/dL (ref 6–24)
Bilirubin Total: 0.8 mg/dL (ref 0.0–1.2)
CO2: 21 mmol/L (ref 20–29)
Calcium: 9.2 mg/dL (ref 8.7–10.2)
Chloride: 104 mmol/L (ref 96–106)
Creatinine, Ser: 0.9 mg/dL (ref 0.57–1.00)
Globulin, Total: 2.8 g/dL (ref 1.5–4.5)
Glucose: 92 mg/dL (ref 70–99)
Potassium: 4.8 mmol/L (ref 3.5–5.2)
Sodium: 136 mmol/L (ref 134–144)
Total Protein: 7.2 g/dL (ref 6.0–8.5)
eGFR: 78 mL/min/{1.73_m2} (ref >59)

## 2022-06-15 LAB — HEMOGLOBIN A1C
Est. average glucose Bld gHb Est-mCnc: 105 mg/dL
Hgb A1c MFr Bld: 5.3 % (ref 4.8–5.6)

## 2022-06-15 LAB — TSH: TSH: 1.45 u[IU]/mL (ref 0.450–4.500)

## 2022-09-13 ENCOUNTER — Ambulatory Visit: Payer: Managed Care, Other (non HMO) | Admitting: Internal Medicine

## 2023-02-20 ENCOUNTER — Ambulatory Visit: Payer: Managed Care, Other (non HMO) | Admitting: Internal Medicine

## 2023-02-20 ENCOUNTER — Encounter: Payer: Self-pay | Admitting: Internal Medicine

## 2023-02-20 ENCOUNTER — Other Ambulatory Visit: Payer: Self-pay

## 2023-02-20 VITALS — BP 118/68 | HR 68 | Temp 98.4°F | Resp 18 | Ht 69.0 in | Wt 263.5 lb

## 2023-02-20 DIAGNOSIS — R059 Cough, unspecified: Secondary | ICD-10-CM | POA: Diagnosis not present

## 2023-02-20 DIAGNOSIS — J40 Bronchitis, not specified as acute or chronic: Secondary | ICD-10-CM | POA: Diagnosis not present

## 2023-02-20 LAB — POC INFLUENZA A&B (BINAX/QUICKVUE)
Influenza A, POC: NEGATIVE
Influenza B, POC: NEGATIVE

## 2023-02-20 LAB — POC COVID19 BINAXNOW: SARS Coronavirus 2 Ag: NEGATIVE

## 2023-02-20 MED ORDER — FLUTICASONE PROPIONATE 50 MCG/ACT NA SUSP: 1.0000 | Freq: Two times a day (BID) | NASAL | 0 refills | Status: AC

## 2023-02-20 MED ORDER — AMOXICILLIN-POT CLAVULANATE 875-125 MG PO TABS: 1.0000 | ORAL_TABLET | Freq: Two times a day (BID) | ORAL | 0 refills | Status: AC

## 2023-02-20 NOTE — Assessment & Plan Note (Signed)
 She probably started out with a viral illness and this has developed into some bacterial bronchitis.  Her lungs are clear.  Her flu and rapid COVID-19 antigen testing was negative.  I will start her on augmentin and flonase.  Continue supportive care.

## 2023-02-20 NOTE — Progress Notes (Signed)
 Office Visit  Subjective   Patient ID: Sarah Dunn   DOB: 1975-08-01   Age: 48 y.o.   MRN: [id number]   Chief Complaint Chief Complaint  Patient presents with   Cough    Congested cough x 2 weeks, greenish, yellow phlegm,headache, denies sorethroat or sob. Lymph nodes in neck bilaterally are swollen and the left one hurts behind her ear. Has been using Mucinex DM and Tylenol.     History of Present Illness Sarah Dunn is a 48 yo female who comes in today with a respiratory illness.  Sarah Dunn states that 2 weeks ago Sarah Dunn started having fevers, myalgias and mild chest congestion with a dry non-productive cough.  Sarah Dunn tested at home for COVID-19 and this was negative.  This did cough did develop into a productive cough of yellow sputum.  Today, Sarah Dunn denies any fevers, chills, myalgias or nausea, vomiting/diarrhea.  Sarah Dunn is having some headaches with sinus congestion with runny nose with yellow nasal discharge and post nasal drip.  There is chest congestion with cough as described but no sore throat, SOB, wheezing or other problems.  Sarah Dunn has been taking mucine D.  Sarah Dunn does not smoke.     Past Medical History Past Medical History:  Diagnosis Date   Family history of breast cancer    Family history of ovarian cancer    Family history of pancreatic cancer      Allergies No Known Allergies   Medications  Current Outpatient Medications:    buPROPion  (WELLBUTRIN  SR) 150 MG 12 hr tablet, Take 1 tablet (150 mg total) by mouth daily., Disp: 90 tablet, Rfl: 3   buPROPion  (WELLBUTRIN  XL) 300 MG 24 hr tablet, Take 1 tablet (300 mg total) by mouth daily., Disp: 90 tablet, Rfl: 3   busPIRone  (BUSPAR ) 7.5 MG tablet, Take 1 tablet (7.5 mg total) by mouth 2 (two) times daily., Disp: 180 tablet, Rfl: 3   gabapentin  (NEURONTIN ) 300 MG capsule, Take 1 capsule (300 mg total) by mouth at bedtime., Disp: 30 capsule, Rfl: 2   propranolol  (INDERAL ) 20 MG tablet, Take 1 tablet (20 mg total) by mouth 2 (two)  times daily., Disp: 180 tablet, Rfl: 3   Semaglutide -Weight Management (WEGOVY ) 0.25 MG/0.5ML SOAJ, Inject 0.25 mg into the skin once a week., Disp: 2 mL, Rfl: 1   Review of Systems Review of Systems  Constitutional:  Negative for chills, fever and malaise/fatigue.  HENT:  Positive for congestion. Negative for sore throat.   Respiratory:  Positive for cough and sputum production. Negative for shortness of breath and wheezing.   Gastrointestinal:  Negative for abdominal pain, diarrhea, nausea and vomiting.  Musculoskeletal:  Negative for myalgias.  Skin:  Negative for rash.  Neurological:  Positive for headaches.       Objective:    Vitals BP 118/68 (BP Location: Left Arm, Patient Position: Sitting)   Pulse 68   Temp 98.4 F (36.9 C) (Temporal)   Resp 18   Ht 5' 9 (1.753 m)   Wt 263 lb 8 oz (119.5 kg)   SpO2 95%   BMI 38.91 kg/m    Physical Examination Physical Exam Constitutional:      Appearance: Normal appearance. Sarah Dunn is not ill-appearing.  HENT:     Right Ear: Tympanic membrane, ear canal and external ear normal.     Left Ear: Tympanic membrane, ear canal and external ear normal.     Nose: Nose normal. No congestion or rhinorrhea.     Mouth/Throat:  Mouth: Mucous membranes are moist.     Pharynx: Oropharynx is clear. No oropharyngeal exudate or posterior oropharyngeal erythema.  Eyes:     General: No scleral icterus.    Conjunctiva/sclera: Conjunctivae normal.     Pupils: Pupils are equal, round, and reactive to light.  Cardiovascular:     Rate and Rhythm: Normal rate and regular rhythm.     Pulses: Normal pulses.     Heart sounds: No murmur heard.    No friction rub. No gallop.  Pulmonary:     Effort: Pulmonary effort is normal. No respiratory distress.     Breath sounds: No wheezing, rhonchi or rales.  Abdominal:     General: Bowel sounds are normal. There is no distension.     Palpations: Abdomen is soft.     Tenderness: There is no abdominal  tenderness.  Musculoskeletal:     Right lower leg: No edema.     Left lower leg: No edema.  Skin:    General: Skin is warm and dry.     Findings: No rash.  Neurological:     Mental Status: Sarah Dunn is alert.        Assessment & Plan:   Bronchitis Sarah Dunn probably started out with a viral illness and this has developed into some bacterial bronchitis.  Her lungs are clear.  Her flu and rapid COVID-19 antigen testing was negative.  I will start her on augmentin  and flonase .  Continue supportive care.    No follow-ups on file.   Selinda Fleeta Finger, MD

## 2023-03-19 ENCOUNTER — Other Ambulatory Visit: Payer: Self-pay | Admitting: Internal Medicine

## 2023-05-31 ENCOUNTER — Encounter: Payer: Self-pay | Admitting: Internal Medicine

## 2023-06-04 ENCOUNTER — Other Ambulatory Visit: Payer: Self-pay

## 2023-06-04 MED ORDER — BUPROPION HCL ER (XL) 300 MG PO TB24: 300.0000 mg | ORAL_TABLET | Freq: Every day | ORAL | 3 refills | Status: AC

## 2023-06-04 MED ORDER — PROPRANOLOL HCL 20 MG PO TABS: 20.0000 mg | ORAL_TABLET | Freq: Two times a day (BID) | ORAL | 3 refills | Status: DC

## 2023-06-04 NOTE — Progress Notes (Signed)
 Rx Refill

## 2023-06-06 ENCOUNTER — Other Ambulatory Visit: Payer: Self-pay | Admitting: Internal Medicine

## 2024-03-10 ENCOUNTER — Other Ambulatory Visit: Payer: Self-pay | Admitting: Internal Medicine
